# Patient Record
Sex: Male | Born: 1955 | Race: Black or African American | Hispanic: No | Marital: Married | Smoking: Current every day smoker
Health system: Southern US, Community
[De-identification: ages and names within clinical notes are randomized; demographics above are authoritative.]

## PROBLEM LIST (undated history)

## (undated) DIAGNOSIS — E119 Type 2 diabetes mellitus without complications: Secondary | ICD-10-CM

## (undated) DIAGNOSIS — I10 Essential (primary) hypertension: Secondary | ICD-10-CM

---

## 2014-06-08 ENCOUNTER — Encounter (HOSPITAL_BASED_OUTPATIENT_CLINIC_OR_DEPARTMENT_OTHER): Payer: Self-pay | Admitting: *Deleted

## 2014-06-08 ENCOUNTER — Emergency Department (HOSPITAL_BASED_OUTPATIENT_CLINIC_OR_DEPARTMENT_OTHER)
Admission: EM | Admit: 2014-06-08 | Discharge: 2014-06-09 | Disposition: A | Discharge status: Home / Self Care | Payer: Managed Care, Other (non HMO) | Attending: Emergency Medicine | Admitting: Emergency Medicine

## 2014-06-08 ENCOUNTER — Emergency Department (HOSPITAL_BASED_OUTPATIENT_CLINIC_OR_DEPARTMENT_OTHER): Payer: Managed Care, Other (non HMO)

## 2014-06-08 DIAGNOSIS — E119 Type 2 diabetes mellitus without complications: Secondary | ICD-10-CM | POA: Diagnosis not present

## 2014-06-08 DIAGNOSIS — I10 Essential (primary) hypertension: Secondary | ICD-10-CM | POA: Diagnosis not present

## 2014-06-08 DIAGNOSIS — Z720 Tobacco use: Secondary | ICD-10-CM | POA: Insufficient documentation

## 2014-06-08 DIAGNOSIS — F17200 Nicotine dependence, unspecified, uncomplicated: Secondary | ICD-10-CM

## 2014-06-08 DIAGNOSIS — J209 Acute bronchitis, unspecified: Secondary | ICD-10-CM

## 2014-06-08 DIAGNOSIS — R05 Cough: Secondary | ICD-10-CM

## 2014-06-08 DIAGNOSIS — Z79899 Other long term (current) drug therapy: Secondary | ICD-10-CM | POA: Diagnosis not present

## 2014-06-08 DIAGNOSIS — R059 Cough, unspecified: Secondary | ICD-10-CM

## 2014-06-08 HISTORY — DX: Type 2 diabetes mellitus without complications: E11.9

## 2014-06-08 HISTORY — DX: Essential (primary) hypertension: I10

## 2014-06-08 MED ORDER — ONDANSETRON HCL 4 MG/2ML IJ SOLN
4.0000 mg | Freq: Once | INTRAMUSCULAR | Status: AC
  Administered 2014-06-09: 4 mg via INTRAVENOUS
  Filled 2014-06-08: qty 2

## 2014-06-08 MED ORDER — SODIUM CHLORIDE 0.9 % IV BOLUS (SEPSIS)
1000.0000 mL | Freq: Once | INTRAVENOUS | Status: AC
  Administered 2014-06-09: 1000 mL via INTRAVENOUS

## 2014-06-08 NOTE — ED Provider Notes (Signed)
CSN: [id number]     Arrival date & time 06/08/14  2222 History   First MD Initiated Contact with Patient 06/08/14 2332     Chief Complaint  Patient presents with  . URI     (Consider location/radiation/quality/duration/timing/severity/associated sxs/prior Treatment) HPI   Alejandro Callahan is a 59 y.o. male with past medical history significant for hypertension, diabetes complaining of productive cough worsening over the course of 3 days. Patient started vomiting this afternoon. Should symptoms of pleuritic chest pain, tactile fever and chills. He reports 3 episodes of nonbloody, nonbilious, non-coffee ground emesis. No formal history of COPD/emphysema. Patient is a smoker, he started smoking 5 years ago. He denies abdominal pain, shortness of breath, change in bowel or bladder habits.   Past Medical History  Diagnosis Date  . Hypertension   . Diabetes mellitus without complication    History reviewed. No pertinent past surgical history. No family history on file. History  Substance Use Topics  . Smoking status: Current Every Day Smoker -- 0.50 packs/day  . Smokeless tobacco: Not on file  . Alcohol Use: No    Review of Systems  10 systems reviewed and found to be negative, except as noted in the HPI.  Allergies  Review of patient's allergies indicates no known allergies.  Home Medications   Prior to Admission medications   Medication Sig Start Date End Date Taking? Authorizing Provider  glipiZIDE (GLUCOTROL) 5 MG tablet Take by mouth daily before breakfast.   Yes Historical Provider, MD  lisinopril (PRINIVIL,ZESTRIL) 20 MG tablet Take 20 mg by mouth daily.   Yes Historical Provider, MD  metFORMIN (GLUCOPHAGE) 1000 MG tablet Take 1,000 mg by mouth 2 (two) times daily with a meal.   Yes Historical Provider, MD  simvastatin (ZOCOR) 20 MG tablet Take 20 mg by mouth daily.   Yes Historical Provider, MD   BP 165/79 mmHg  Pulse 68  Temp(Src) 98.3 F (36.8 C) (Oral)  Resp 20  Wt  230 lb (104.327 kg)  SpO2 97% Physical Exam  Constitutional: He is oriented to person, place, and time. He appears well-developed and well-nourished. No distress.  HENT:  Head: Normocephalic and atraumatic.  Mouth/Throat: Oropharynx is clear and moist.  Eyes: Conjunctivae and EOM are normal. Pupils are equal, round, and reactive to light.  Neck: Normal range of motion.  Cardiovascular: Normal rate, regular rhythm and intact distal pulses.   Pulmonary/Chest: Effort normal and breath sounds normal. No stridor. No respiratory distress. He has no wheezes. He has no rales. He exhibits no tenderness.  Abdominal: Soft. Bowel sounds are normal. There is no tenderness.  Musculoskeletal: Normal range of motion.  Neurological: He is alert and oriented to person, place, and time.  Skin: He is not diaphoretic.  Psychiatric: He has a normal mood and affect.  Nursing note and vitals reviewed.   ED Course  Procedures (including critical care time) Labs Review Labs Reviewed - No data to display  Imaging Review No results found.   EKG Interpretation   Date/Time:  Saturday June 08 2014 23:50:26 EDT Ventricular Rate:  54 PR Interval:  158 QRS Duration: 98 QT Interval:  422 QTC Calculation: 400 R Axis:   5 Text Interpretation:  Sinus bradycardia Confirmed by [facility]  MD,  APRIL ([id number]) on 06/08/2014 11:53:31 PM      MDM   Final diagnoses:  Cough    Filed Vitals:   06/08/14 0228 06/08/14 2231  BP: 165/79   Pulse: 68   Temp: 98.3 F (  36.8 C)   TempSrc: Oral   Resp: 20   Weight: 230 lb (104.327 kg) 230 lb (104.327 kg)  SpO2: 97%     Medications  sodium chloride 0.9 % bolus 1,000 mL (not administered)  ondansetron (ZOFRAN) injection 4 mg (not administered)    Alejandro Callahan is a pleasant 59 y.o. male presenting with emesis, productive cough, tactile fever and chills. Patient is non-insulin-dependent diabetic sounds are clear to auscultation, patient is saturating well on  room air. Patient reports fever at home but vital signs here are without acute abnormality. Will check basic blood work, chest x-ray, bolus and given Zofran and by mouth challenge.  Case signed out to Dr. Nicanor Alcon at shift change: plan to f/u bloodwork and cxr. Pepped DC paperwork for acute brochitis.   Wynetta Emery, PA-C 06/09/14 1208  April Palumbo, MD 06/09/14 2300

## 2014-06-08 NOTE — ED Notes (Signed)
Pt here for cough and cold and sinus problems as well as some nausea and vomiting but no abdominal pain.  Symptoms have been progressing over the last couple of days

## 2014-06-09 DIAGNOSIS — J209 Acute bronchitis, unspecified: Secondary | ICD-10-CM | POA: Diagnosis not present

## 2014-06-09 LAB — CBC WITH DIFFERENTIAL/PLATELET
BASOS PCT: 1 % (ref 0–1)
Basophils Absolute: 0 10*3/uL (ref 0.0–0.1)
EOS PCT: 3 % (ref 0–5)
Eosinophils Absolute: 0.2 10*3/uL (ref 0.0–0.7)
HCT: 37.4 % — ABNORMAL LOW (ref 39.0–52.0)
HEMOGLOBIN: 12.6 g/dL — AB (ref 13.0–17.0)
LYMPHS ABS: 2.1 10*3/uL (ref 0.7–4.0)
LYMPHS PCT: 35 % (ref 12–46)
MCH: 29.5 pg (ref 26.0–34.0)
MCHC: 33.7 g/dL (ref 30.0–36.0)
MCV: 87.6 fL (ref 78.0–100.0)
MONO ABS: 0.9 10*3/uL (ref 0.1–1.0)
Monocytes Relative: 15 % — ABNORMAL HIGH (ref 3–12)
Neutro Abs: 2.8 10*3/uL (ref 1.7–7.7)
Neutrophils Relative %: 46 % (ref 43–77)
Platelets: 173 10*3/uL (ref 150–400)
RBC: 4.27 MIL/uL (ref 4.22–5.81)
RDW: 12.4 % (ref 11.5–15.5)
WBC: 6 10*3/uL (ref 4.0–10.5)

## 2014-06-09 LAB — COMPREHENSIVE METABOLIC PANEL
ALK PHOS: 46 U/L (ref 38–126)
ALT: 25 U/L (ref 17–63)
AST: 20 U/L (ref 15–41)
Albumin: 3.9 g/dL (ref 3.5–5.0)
Anion gap: 4 — ABNORMAL LOW (ref 5–15)
BUN: 13 mg/dL (ref 6–20)
CO2: 26 mmol/L (ref 22–32)
Calcium: 8.1 mg/dL — ABNORMAL LOW (ref 8.9–10.3)
Chloride: 112 mmol/L — ABNORMAL HIGH (ref 101–111)
Creatinine, Ser: 1.25 mg/dL — ABNORMAL HIGH (ref 0.61–1.24)
GFR calc Af Amer: >60 mL/min (ref >60)
Glucose, Bld: 134 mg/dL — ABNORMAL HIGH (ref 65–99)
POTASSIUM: 3.2 mmol/L — AB (ref 3.5–5.1)
Sodium: 142 mmol/L (ref 135–145)
TOTAL PROTEIN: 6.3 g/dL — AB (ref 6.5–8.1)
Total Bilirubin: 0.5 mg/dL (ref 0.3–1.2)

## 2014-06-09 LAB — LIPASE, BLOOD: LIPASE: 23 U/L (ref 22–51)

## 2014-06-09 MED ORDER — POTASSIUM CHLORIDE CRYS ER 20 MEQ PO TBCR
40.0000 meq | EXTENDED_RELEASE_TABLET | Freq: Once | ORAL | Status: AC
  Administered 2014-06-09: 40 meq via ORAL
  Filled 2014-06-09: qty 2

## 2014-06-09 MED ORDER — ALBUTEROL SULFATE HFA 108 (90 BASE) MCG/ACT IN AERS
2.0000 | INHALATION_SPRAY | Freq: Once | RESPIRATORY_TRACT | Status: AC
  Administered 2014-06-09: 2 via RESPIRATORY_TRACT
  Filled 2014-06-09: qty 6.7

## 2014-06-09 MED ORDER — BENZONATATE 100 MG PO CAPS: 200.0000 mg | ORAL_CAPSULE | Freq: Two times a day (BID) | ORAL | Status: AC | PRN

## 2014-06-09 MED ORDER — SULFAMETHOXAZOLE-TRIMETHOPRIM 800-160 MG PO TABS: 1.0000 | ORAL_TABLET | Freq: Two times a day (BID) | ORAL | Status: AC

## 2014-06-09 MED ORDER — ONDANSETRON HCL 4 MG PO TABS: 4.0000 mg | ORAL_TABLET | Freq: Three times a day (TID) | ORAL | Status: AC | PRN

## 2014-06-09 MED ORDER — SULFAMETHOXAZOLE-TRIMETHOPRIM 800-160 MG PO TABS
1.0000 | ORAL_TABLET | Freq: Once | ORAL | Status: AC
  Administered 2014-06-09: 1 via ORAL
  Filled 2014-06-09 (×2): qty 1

## 2014-06-09 NOTE — Discharge Instructions (Signed)
Take your antibiotics as directed and to completion. You should never have any leftover antibiotics! Push fluids and stay well hydrated.   Do not hesitate to return to the emergency room for any new, worsening or concerning symptoms.  Please obtain primary care using resource guide below. Let them know that you were seen in the emergency room and that they will need to obtain records for further outpatient management.   Acute Bronchitis Bronchitis is inflammation of the airways that extend from the windpipe into the lungs (bronchi). The inflammation often causes mucus to develop. This leads to a cough, which is the most common symptom of bronchitis.  In acute bronchitis, the condition usually develops suddenly and goes away over time, usually in a couple weeks. Smoking, allergies, and asthma can make bronchitis worse. Repeated episodes of bronchitis may cause further lung problems.  CAUSES Acute bronchitis is most often caused by the same virus that causes a cold. The virus can spread from person to person (contagious) through coughing, sneezing, and touching contaminated objects. SIGNS AND SYMPTOMS   Cough.   Fever.   Coughing up mucus.   Body aches.   Chest congestion.   Chills.   Shortness of breath.   Sore throat.  DIAGNOSIS  Acute bronchitis is usually diagnosed through a physical exam. Your health care provider will also ask you questions about your medical history. Tests, such as chest X-rays, are sometimes done to rule out other conditions.  TREATMENT  Acute bronchitis usually goes away in a couple weeks. Oftentimes, no medical treatment is necessary. Medicines are sometimes given for relief of fever or cough. Antibiotic medicines are usually not needed but may be prescribed in certain situations. In some cases, an inhaler may be recommended to help reduce shortness of breath and control the cough. A cool mist vaporizer may also be used to help thin bronchial  secretions and make it easier to clear the chest.  HOME CARE INSTRUCTIONS  Get plenty of rest.   Drink enough fluids to keep your urine clear or pale yellow (unless you have a medical condition that requires fluid restriction). Increasing fluids may help thin your respiratory secretions (sputum) and reduce chest congestion, and it will prevent dehydration.   Take medicines only as directed by your health care provider.  If you were prescribed an antibiotic medicine, finish it all even if you start to feel better.  Avoid smoking and secondhand smoke. Exposure to cigarette smoke or irritating chemicals will make bronchitis worse. If you are a smoker, consider using nicotine gum or skin patches to help control withdrawal symptoms. Quitting smoking will help your lungs heal faster.   Reduce the chances of another bout of acute bronchitis by washing your hands frequently, avoiding people with cold symptoms, and trying not to touch your hands to your mouth, nose, or eyes.   Keep all follow-up visits as directed by your health care provider.  SEEK MEDICAL CARE IF: Your symptoms do not improve after 1 week of treatment.  SEEK IMMEDIATE MEDICAL CARE IF:  You develop an increased fever or chills.   You have chest pain.   You have severe shortness of breath.  You have bloody sputum.   You develop dehydration.  You faint or repeatedly feel like you are going to pass out.  You develop repeated vomiting.  You develop a severe headache. MAKE SURE YOU:   Understand these instructions.  Will watch your condition.  Will get help right away if you are not  doing well or get worse. Document Released: 01/29/2004 Document Revised: 05/07/2013 Document Reviewed: 06/13/2012 [facility] Patient Information 2015 [place], [place]. This information is not intended to replace advice given to you by your health care provider. Make sure you discuss any questions you have with your health care  provider.   Emergency Department Resource Guide 1) Find a Doctor and Pay Out of Pocket Although you won't have to find out who is covered by your insurance plan, it is a good idea to ask around and get recommendations. You will then need to call the office and see if the doctor you have chosen will accept you as a new patient and what types of options they offer for patients who are self-pay. Some doctors offer discounts or will set up payment plans for their patients who do not have insurance, but you will need to ask so you aren't surprised when you get to your appointment.  2) Contact Your Local Health Department Not all health departments have doctors that can see patients for sick visits, but many do, so it is worth a call to see if yours does. If you don't know where your local health department is, you can check in your phone book. The CDC also has a tool to help you locate your state's health department, and many state websites also have listings of all of their local health departments.  3) Find a Walk-in Clinic If your illness is not likely to be very severe or complicated, you may want to try a walk in clinic. These are popping up all over the country in pharmacies, drugstores, and shopping centers. They're usually staffed by nurse practitioners or physician assistants that have been trained to treat common illnesses and complaints. They're usually fairly quick and inexpensive. However, if you have serious medical issues or chronic medical problems, these are probably not your best option.  No Primary Care Doctor: - Call Health Connect at  [phone] - they can help you locate a primary care doctor that  accepts your insurance, provides certain services, etc. - Physician Referral Service- [phone]  Chronic Pain Problems: Organization         Address  Phone   Notes  [facility]  [phone] Patients need to be referred by their primary care doctor.    Medication Assistance: Organization         Address  Phone   Notes  [facility] Medication [facility] [address] [place] [place], [place] [address] [phone] --Must be a resident of [facility] -- Must have NO insurance coverage whatsoever (no Medicaid/ Medicare, etc.) -- The pt. MUST have a primary care doctor that directs their care regularly and follows them in the community   [facility]  [phone]   [employer]  [phone]    Agencies that provide inexpensive medical care: Organization         Address  Phone   Notes  [facility]  [phone]   [facility]    [phone]   [facility] [address] [place], [place] [address] [phone]   [facility] [address] [place] [phone]   Planned Parenthood    [phone]   [facility]    [phone]   [facility]  [address], [place] Phone:  [phone], Fax:  [phone] Hours of Operation:  9  am - 6 pm, M-F.  Also accepts Medicaid/Medicare and self-pay.  [facility]  [address], [place] Phone: [phone], Fax: [phone]. Hours of Operation:  8:30 am - 5:30 pm, M-F.  Also accepts Medicaid and self-pay.  [facility] [place] [address] [place] Phone: [phone]   [facility] [address] Natasha Bence [place], [place] [phone], Ext. [phone] Mondays & Thursdays: 7-9 AM.  First 15 patients are seen on a first come, first serve basis.    Medicaid-accepting [facility] Providers:  Organization         Address  Phone   Notes  [facility] [address], [place] [phone] Also accepts self-pay patients.  [facility] [address] Laurell Josephs [place], [place]  [phone]   [facility] [address], [place] [phone]   [facility] [address] [place] [phone]   Renaye Rakers [address], [place]   [phone] Only accepts [place] Access [place] patients after they have their name applied to their card.   Self-Pay (no insurance) in [facility]:  Organization         Address  Phone   Notes  Sickle Cell Patients, [facility] [address] [place], [place] [phone]   [facility] [address] [place], [place] [phone]   [facility]  [address],  [phone]   [facility]/Dr. Osei-Bonsu  [address] [place] or [address], [place] [phone] Phone number for both [place] and [place] locations is the same.  [facility] [address] [place] [phone]   [facility] [address], [place] or [address] [phone]   [facility] [address], [place] [phone], phone; [phone], fax Sees patients 1st and 3rd Saturday of every month.  Must not qualify for public or private insurance (i.e. Medicaid, Medicare, [place] Health Choice, Veterans' Benefits)  Household income should be no more than 200% of the poverty level The clinic cannot treat you if you are pregnant or think you are pregnant  Sexually transmitted diseases are not treated at the clinic.    Dental Care: Organization         Address  Phone  Notes  [facility] of [facility] [address] [place], [place] [phone] Accepts children up to age 68 who are enrolled in [place] or [place] Health Choice; pregnant women with a Medicaid card; and children who have applied for Medicaid or [place] Health Choice, but were declined, whose parents can pay a reduced fee at time of service.   [facility] of [facility]  [address], [place] [phone] Accepts children up to age 64 who are enrolled in [place] or [place] Health Choice; pregnant women with a Medicaid card; and children who have applied for Medicaid or [place] Health Choice, but were declined, whose parents can pay a reduced fee at time of service.  [facility]  [address] [place], [place] [phone] Patients are seen by appointment only. Walk-ins are not accepted. [facility] will see patients 18 years of  age and older. Monday - Tuesday (8am-5pm) Most Wednesdays (8:30-5pm) $30 per visit, cash only  [facility] Adult [facility] Access [facility]  [address], [facility] [phone] Patients are seen by appointment only. Walk-ins are not accepted. [facility] will see patients 71 years of age and older. One Wednesday Evening (Monthly: Volunteer Based).  $30 per visit, cash only  [employer]  [phone] for adults; Children under age 14, call Graduate Pediatric Dentistry at [phone]. Children aged 61-14, please call [phone] to request a pediatric application.  Dental services are provided in all areas of dental care including fillings, crowns and bridges, complete and partial dentures, implants, gum treatment, root canals, and extractions. Preventive care is also provided. Treatment is provided to both adults and children. Patients are selected via a lottery and there is often a waiting list.   [facility] [address], [place]  [phone] [email or web address]   [facility] [address] [place], [place] [phone], Ext. [phone] Second and Fourth Thursday of each month, opens at 6:30 AM; Clinic ends at 9 AM.  Patients are seen on a first-come first-served basis, and a limited number are seen during each clinic.   [facility]  [address] Ether [place]ins [place], [place] [phone]   Eligibility Requirements You must have lived in [place], [place], or [place] counties for at least the last three months.   You cannot be eligible for state or federal sponsored [place], including [employer], [place], or [employer].   You generally cannot be eligible for healthcare insurance through your employer.    How to apply: Eligibility screenings are held every Tuesday and Wednesday afternoon from 1:00 pm until 4:00 pm. You do not need an appointment for the interview!  [facility] [address], [place], [place] [phone]   [facility] Health Department  [phone]   [facility] Health Department  [phone]   [facility] Health Department  [phone]    Behavioral Health Resources in the Community: Intensive Outpatient Programs Organization         Address  Phone  Notes  [facility] Services [address] [place], [place] [phone]   [facility] Outpatient [address] [place], [place] [phone]   [facility] [address] [place], [place]  [phone]   [facility] Mental Health [address],  [place], [place] [phone] or [phone]   Substance Abuse Resources Organization         Address  Phone  Notes  [facility]  [phone]   [facility]  [phone]   [facility] [place]  [phone]   Floydene Flock  [phone]   Residential & Outpatient Substance Abuse Program  [phone]   Psychological Services Organization         Address  Phone  Notes  [facility] Health  [phone]   [facility] Services  [phone]   [facility] Mental Health [address] [place] [phone] or [phone]    Mobile Crisis Teams Organization         Address  Phone  Notes  [facility], Mobile Crisis Care Unit  [phone]   [facility]  [address] [place], [place] [phone]   [facility] [address] [place] [phone]    Self-Help/Support Groups Organization  Address  Phone             Notes  [facility] [place] - variety of support groups  336- I7437963 Call for more information  Narcotics Anonymous (NA), [facility] [address], [employer] [place]  2 meetings at this location   Statistician         Address  Phone  Notes  [facility] [address],    [place]  [phone]   [facility]  [address] [place] [address], [place], [place] [phone]   [facility] [address] [place], [place] [phone] Admissions: 8am-3pm M-F  [facility] [address]    [place], [place] [phone]   [facility] [address] [place], [place], [place] [phone]   [facility] [address] [place] [address]  [place], [place] [phone]   [facility] - Intensive Outpatient [address] Laurell Josephs [address], [place], [place] [phone]   [facility] ([facility] [address] [place]  [place], [place] [phone] or [phone]   [facility] ([facility]) [address], [place], [place] [phone] Accepts Medicaid  [facility] [address],  [place] [phone] Substance Abuse/Addiction Treatment   [facility] Organization         Address  Phone  Notes  [facility]  [phone]   Angie Fava, PhD [address] Ervin Knack [place], [place]   [phone] or [phone]   [facility] Behavioral   [address] [place], [place] [phone]   [facility] [address], [place], [place] [phone] Insurance/Medicaid/sponsorship through [facility] and Families [address]                                    [place], [place] [phone]  Therapy/tele-psych/case  [facility] [address], [place] [phone]    Dr. Lolly Mustache  [phone]   [facility] [place] [facility]  [employer] [facility] Dept. 1) [address] [place] 2) [address] [place] 3)  [address], [place] [phone]  [phone]   [facility] Child Abuse Hotline [phone] or [phone] (After Hours)

## 2014-06-09 NOTE — ED Provider Notes (Signed)
Medical screening examination/treatment/procedure(s) were performed by non-physician practitioner and as supervising physician I was immediately available for consultation/collaboration.   EKG Interpretation   Date/Time:  Saturday June 08 2014 23:50:26 EDT Ventricular Rate:  54 PR Interval:  158 QRS Duration: 98 QT Interval:  422 QTC Calculation: 400 R Axis:   5 Text Interpretation:  Sinus bradycardia Confirmed by [facility]  MD,  Monterrius Cardosa ([id number]) on 06/08/2014 11:53:31 PM       Reilyn Nelson, MD 06/09/14 [id number]

## 2016-07-07 IMAGING — DX DG CHEST 2V
2 series · 2 of 2 positions shown · non-contrast
Comparison: None.

CLINICAL DATA: Cough and congestion for 3 days.

EXAM:
CHEST  2 VIEW

[chest pa]
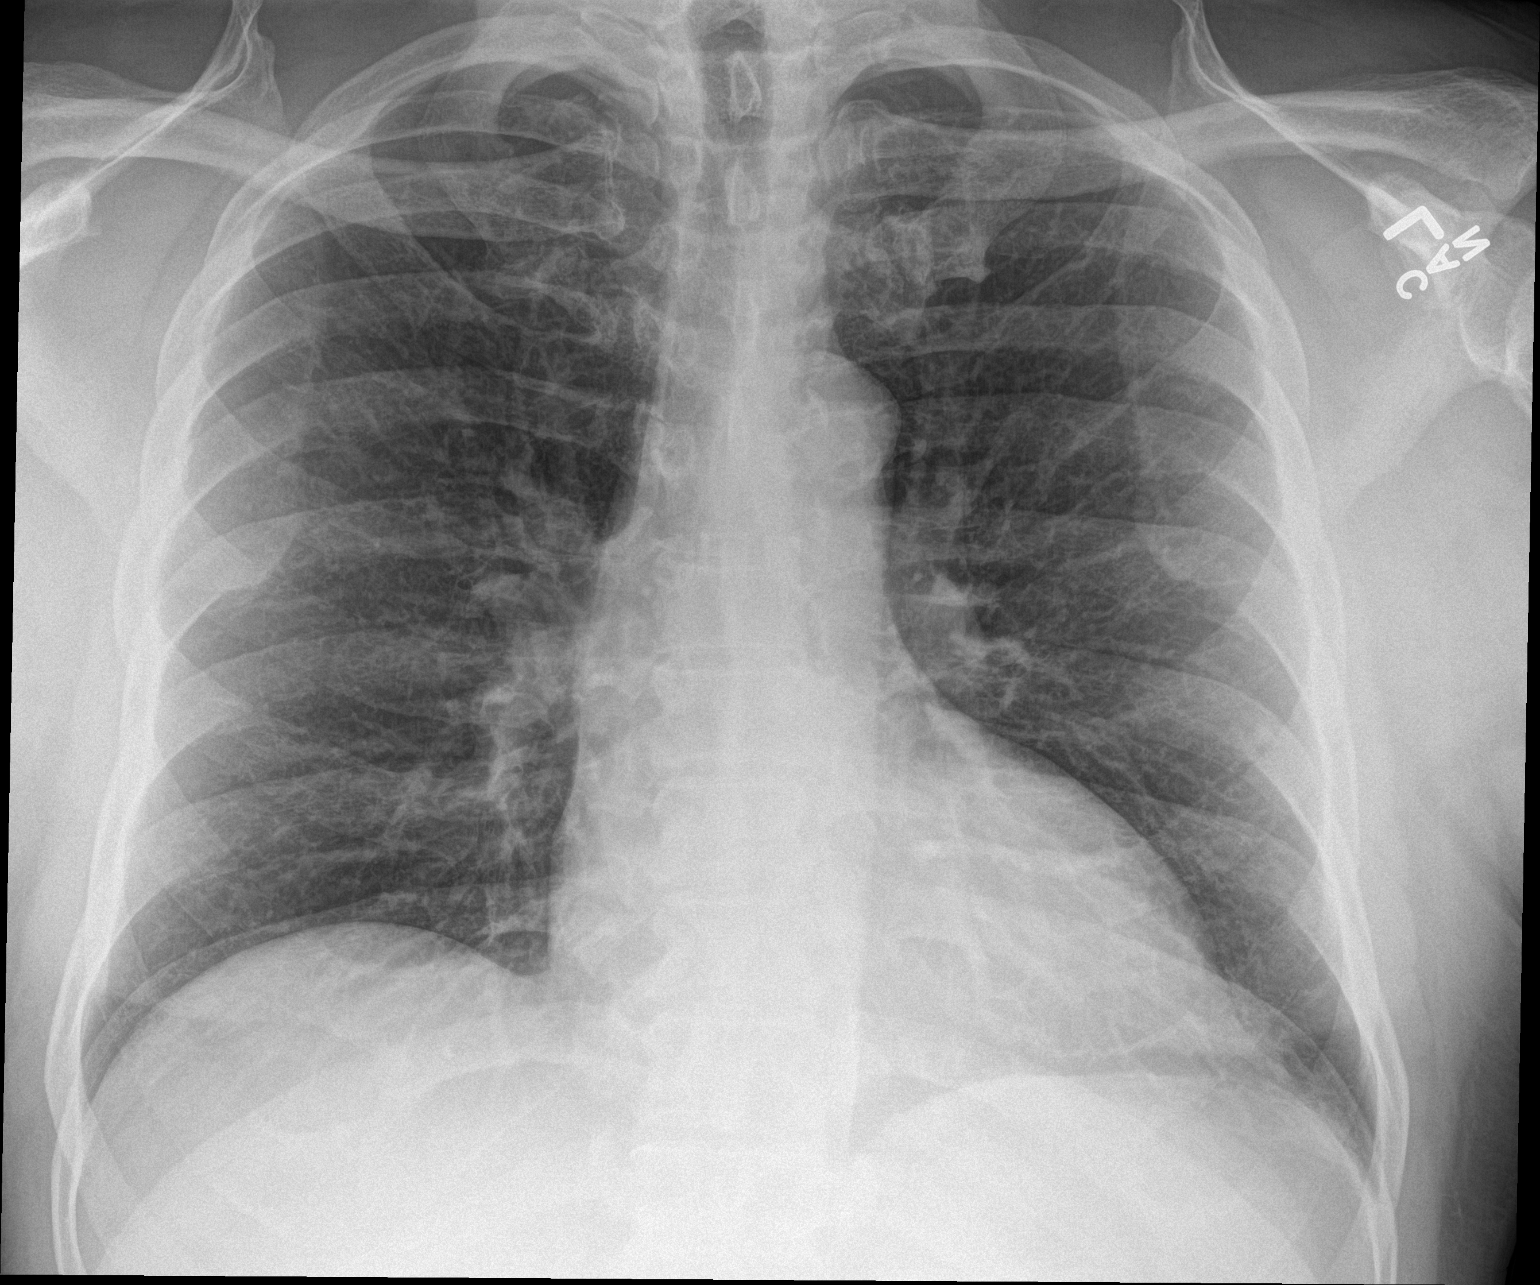

[chest lat]
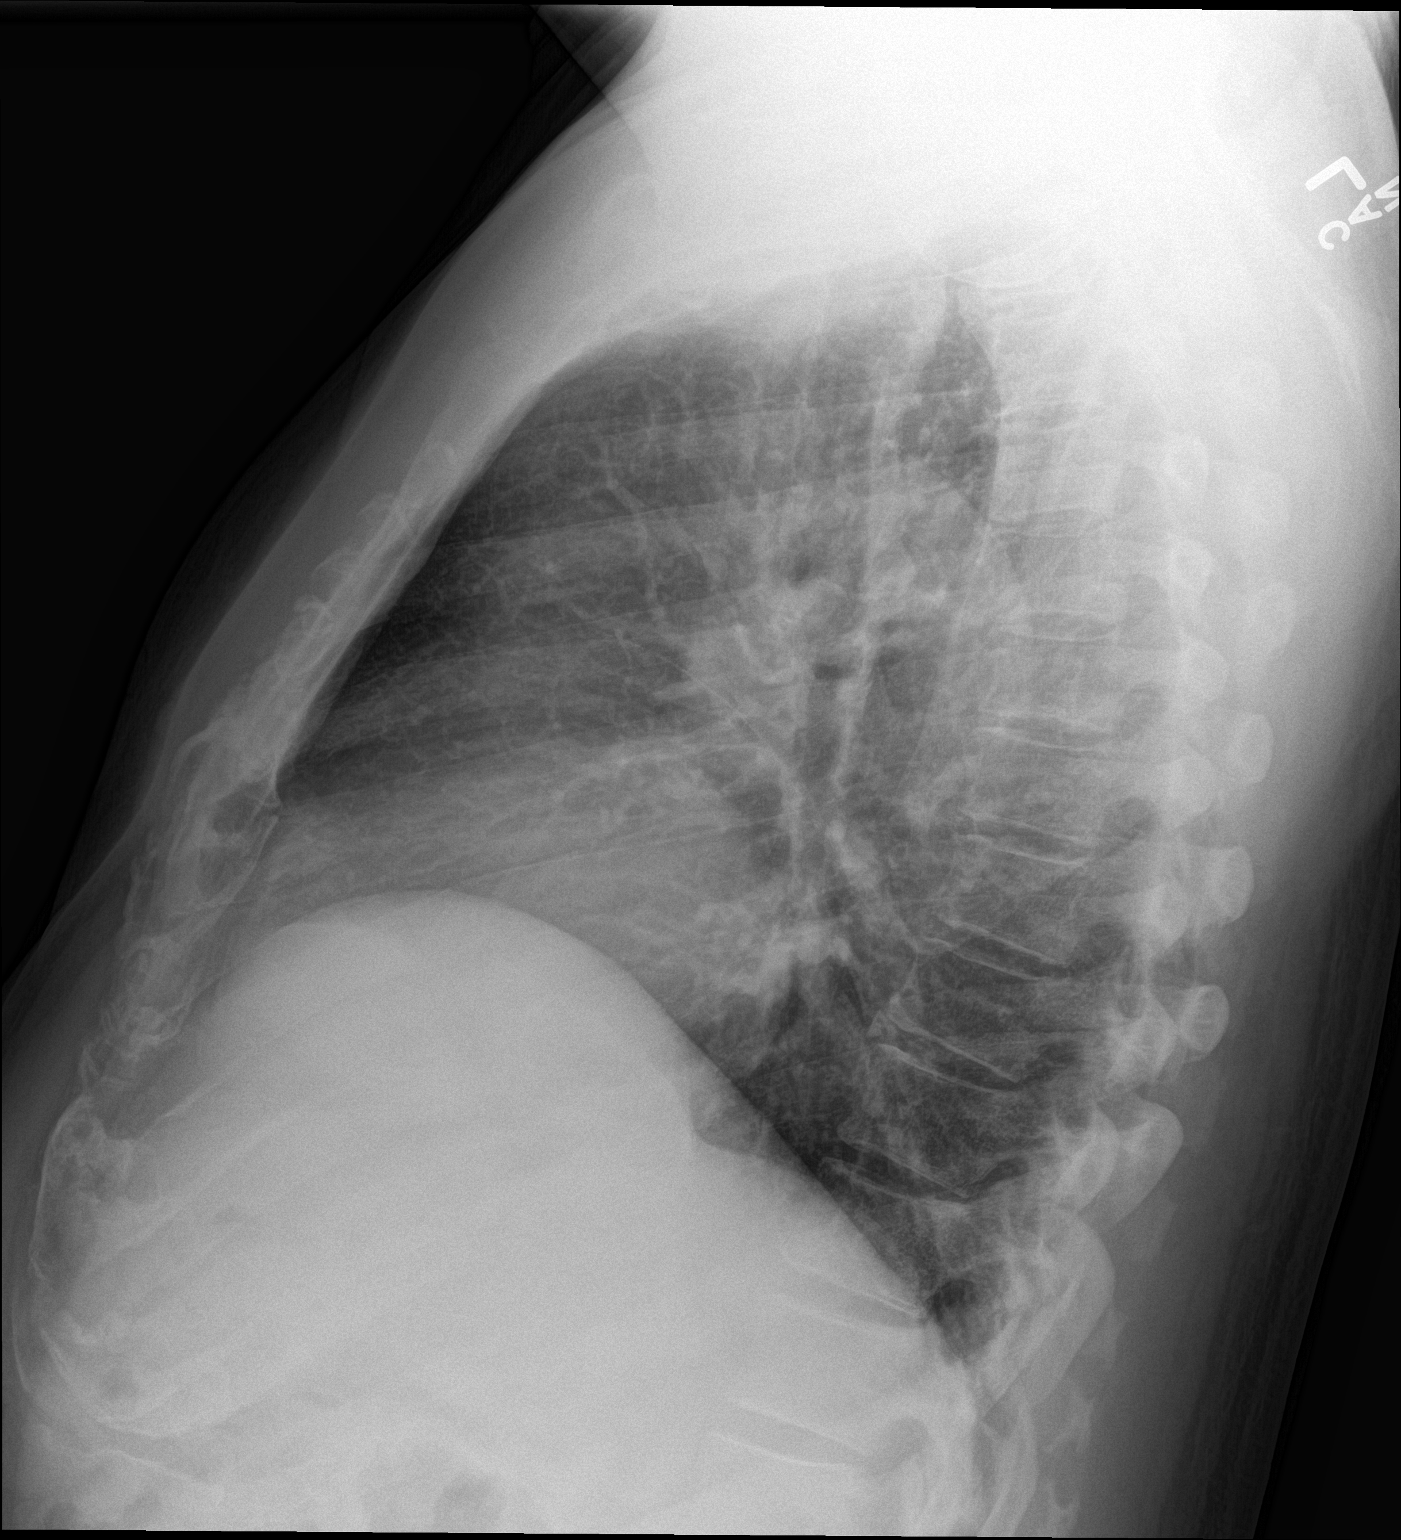

[2 of 2 positions shown; findings below may reference images not displayed]

FINDINGS: The cardiomediastinal contours are normal. The lungs are clear.
Pulmonary vasculature is normal. No consolidation, pleural effusion,
or pneumothorax. No acute osseous abnormalities are seen.
IMPRESSION: No acute pulmonary process.
